# Patient Record
Sex: Female | Born: 1975 | Race: White | Hispanic: No | Marital: Married | Smoking: Never smoker
Health system: Southern US, Community
[De-identification: ages and names within clinical notes are randomized; demographics above are authoritative.]

## PROBLEM LIST (undated history)

## (undated) DIAGNOSIS — T8859XA Other complications of anesthesia, initial encounter: Secondary | ICD-10-CM

## (undated) DIAGNOSIS — G43909 Migraine, unspecified, not intractable, without status migrainosus: Secondary | ICD-10-CM

## (undated) DIAGNOSIS — Z8489 Family history of other specified conditions: Secondary | ICD-10-CM

## (undated) DIAGNOSIS — Z973 Presence of spectacles and contact lenses: Secondary | ICD-10-CM

---

## 2002-07-21 HISTORY — PX: CHOLECYSTECTOMY: SHX55

## 2003-06-22 ENCOUNTER — Inpatient Hospital Stay (HOSPITAL_COMMUNITY): Admission: RE | Admit: 2003-06-22 | Discharge: 2003-06-22 | Payer: Self-pay | Admitting: Internal Medicine

## 2003-06-23 HISTORY — PX: ERCP W/ SPHICTEROTOMY: SHX1523

## 2011-07-22 DIAGNOSIS — Z87442 Personal history of urinary calculi: Secondary | ICD-10-CM

## 2011-07-22 HISTORY — DX: Personal history of urinary calculi: Z87.442

## 2017-04-07 DIAGNOSIS — Z01419 Encounter for gynecological examination (general) (routine) without abnormal findings: Secondary | ICD-10-CM | POA: Diagnosis not present

## 2017-04-15 DIAGNOSIS — Z30430 Encounter for insertion of intrauterine contraceptive device: Secondary | ICD-10-CM | POA: Diagnosis not present

## 2017-04-29 DIAGNOSIS — Z30014 Encounter for initial prescription of intrauterine contraceptive device: Secondary | ICD-10-CM | POA: Diagnosis not present

## 2017-04-29 DIAGNOSIS — Z30433 Encounter for removal and reinsertion of intrauterine contraceptive device: Secondary | ICD-10-CM | POA: Diagnosis not present

## 2018-07-21 DIAGNOSIS — U071 COVID-19: Secondary | ICD-10-CM

## 2018-07-21 HISTORY — DX: COVID-19: U07.1

## 2018-12-06 DIAGNOSIS — G43009 Migraine without aura, not intractable, without status migrainosus: Secondary | ICD-10-CM | POA: Diagnosis not present

## 2018-12-06 DIAGNOSIS — Z6833 Body mass index (BMI) 33.0-33.9, adult: Secondary | ICD-10-CM | POA: Diagnosis not present

## 2019-01-17 DIAGNOSIS — Z6830 Body mass index (BMI) 30.0-30.9, adult: Secondary | ICD-10-CM | POA: Diagnosis not present

## 2019-01-17 DIAGNOSIS — R4582 Worries: Secondary | ICD-10-CM | POA: Diagnosis not present

## 2019-01-17 DIAGNOSIS — G43009 Migraine without aura, not intractable, without status migrainosus: Secondary | ICD-10-CM | POA: Diagnosis not present

## 2019-05-03 DIAGNOSIS — Z6830 Body mass index (BMI) 30.0-30.9, adult: Secondary | ICD-10-CM | POA: Diagnosis not present

## 2019-05-03 DIAGNOSIS — G43009 Migraine without aura, not intractable, without status migrainosus: Secondary | ICD-10-CM | POA: Diagnosis not present

## 2021-05-07 ENCOUNTER — Other Ambulatory Visit (HOSPITAL_COMMUNITY): Payer: Self-pay | Admitting: Family Medicine

## 2021-05-07 ENCOUNTER — Other Ambulatory Visit: Payer: Self-pay | Admitting: Family Medicine

## 2021-05-07 DIAGNOSIS — R319 Hematuria, unspecified: Secondary | ICD-10-CM

## 2021-05-07 DIAGNOSIS — N23 Unspecified renal colic: Secondary | ICD-10-CM

## 2021-05-10 ENCOUNTER — Other Ambulatory Visit (HOSPITAL_COMMUNITY): Payer: Self-pay | Admitting: Family Medicine

## 2021-05-10 DIAGNOSIS — Z1231 Encounter for screening mammogram for malignant neoplasm of breast: Secondary | ICD-10-CM

## 2021-05-22 ENCOUNTER — Ambulatory Visit (HOSPITAL_COMMUNITY): Payer: Self-pay

## 2021-05-24 ENCOUNTER — Ambulatory Visit (HOSPITAL_COMMUNITY)
Admission: RE | Admit: 2021-05-24 | Discharge: 2021-05-24 | Disposition: A | Discharge status: Home / Self Care | Payer: 59 | Source: Ambulatory Visit | Attending: Family Medicine | Admitting: Family Medicine

## 2021-05-24 ENCOUNTER — Other Ambulatory Visit: Payer: Self-pay

## 2021-05-24 DIAGNOSIS — N23 Unspecified renal colic: Secondary | ICD-10-CM | POA: Diagnosis present

## 2021-05-24 DIAGNOSIS — R319 Hematuria, unspecified: Secondary | ICD-10-CM | POA: Diagnosis present

## 2021-05-29 ENCOUNTER — Other Ambulatory Visit: Payer: Self-pay

## 2021-05-29 ENCOUNTER — Ambulatory Visit (HOSPITAL_COMMUNITY)
Admission: RE | Admit: 2021-05-29 | Discharge: 2021-05-29 | Disposition: A | Discharge status: Home / Self Care | Payer: 59 | Source: Ambulatory Visit | Attending: Family Medicine | Admitting: Family Medicine

## 2021-05-29 DIAGNOSIS — Z1231 Encounter for screening mammogram for malignant neoplasm of breast: Secondary | ICD-10-CM | POA: Diagnosis present

## 2021-06-04 ENCOUNTER — Other Ambulatory Visit: Payer: Self-pay | Admitting: Urology

## 2021-06-21 ENCOUNTER — Other Ambulatory Visit: Payer: Self-pay

## 2021-06-21 ENCOUNTER — Encounter (HOSPITAL_BASED_OUTPATIENT_CLINIC_OR_DEPARTMENT_OTHER): Payer: Self-pay | Admitting: Urology

## 2021-06-21 NOTE — Progress Notes (Signed)
Spoke w/ via phone for pre-op interview---patient Lab needs dos----urine POCT               Lab results------none COVID test -----patient states asymptomatic no test needed Arrive at -------0900 on 06/26/2021 NPO after MN NO Solid Food.  Clear liquids from MN until---0800 Med rec completed Medications to take morning of surgery -----Topamax  Diabetic medication -----n/a Patient instructed no nail polish to be worn day of surgery Patient instructed to bring photo id and insurance card day of surgery Patient aware to have Driver (ride ) / caregiver    for 24 hours after surgery  Patient Special Instructions -----none Pre-Op special Istructions -----none Patient verbalized understanding of instructions that were given at this phone interview. Patient denies shortness of breath, chest pain, fever, cough at this phone interview.   Reviewed case with Dr. Okey Dupre, anesthesiologist. Patient cannot receive succinylcholine or any drug its class due to not waking up and remaining on the vent for up to 13 hours after surgery. Per Dr. Okey Dupre, [facility] to do patient at [facility].

## 2021-06-26 ENCOUNTER — Ambulatory Visit (HOSPITAL_BASED_OUTPATIENT_CLINIC_OR_DEPARTMENT_OTHER): Payer: 59 | Admitting: Anesthesiology

## 2021-06-26 ENCOUNTER — Encounter (HOSPITAL_BASED_OUTPATIENT_CLINIC_OR_DEPARTMENT_OTHER)
Admission: RE | Disposition: A | Discharge status: Home / Self Care | Payer: Self-pay | Source: Home / Self Care | Attending: Urology

## 2021-06-26 ENCOUNTER — Ambulatory Visit (HOSPITAL_BASED_OUTPATIENT_CLINIC_OR_DEPARTMENT_OTHER)
Admission: RE | Admit: 2021-06-26 | Discharge: 2021-06-26 | Disposition: A | Discharge status: Home / Self Care | Payer: 59 | Attending: Urology | Admitting: Urology

## 2021-06-26 ENCOUNTER — Encounter (HOSPITAL_BASED_OUTPATIENT_CLINIC_OR_DEPARTMENT_OTHER): Payer: Self-pay | Admitting: Urology

## 2021-06-26 DIAGNOSIS — N132 Hydronephrosis with renal and ureteral calculous obstruction: Secondary | ICD-10-CM | POA: Diagnosis present

## 2021-06-26 DIAGNOSIS — R519 Headache, unspecified: Secondary | ICD-10-CM | POA: Diagnosis not present

## 2021-06-26 HISTORY — PX: CYSTOSCOPY/URETEROSCOPY/HOLMIUM LASER/STENT PLACEMENT: SHX6546

## 2021-06-26 HISTORY — DX: Presence of spectacles and contact lenses: Z97.3

## 2021-06-26 HISTORY — DX: Migraine, unspecified, not intractable, without status migrainosus: G43.909

## 2021-06-26 HISTORY — DX: Other complications of anesthesia, initial encounter: T88.59XA

## 2021-06-26 HISTORY — DX: Family history of other specified conditions: Z84.89

## 2021-06-26 LAB — POCT PREGNANCY, URINE: Preg Test, Ur: NEGATIVE

## 2021-06-26 SURGERY — CYSTOSCOPY/URETEROSCOPY/HOLMIUM LASER/STENT PLACEMENT
Anesthesia: General | Laterality: Left

## 2021-06-26 MED ORDER — ALBUMIN HUMAN 5 % IV SOLN
INTRAVENOUS | Status: AC
  Filled 2021-06-26: qty 250

## 2021-06-26 MED ORDER — ONDANSETRON HCL 4 MG/2ML IJ SOLN
INTRAMUSCULAR | Status: DC | PRN
  Administered 2021-06-26: 4 mg via INTRAVENOUS

## 2021-06-26 MED ORDER — ACETAMINOPHEN 500 MG PO TABS
1000.0000 mg | ORAL_TABLET | Freq: Once | ORAL | Status: AC
  Administered 2021-06-26: 1000 mg via ORAL

## 2021-06-26 MED ORDER — LIDOCAINE 2% (20 MG/ML) 5 ML SYRINGE
INTRAMUSCULAR | Status: AC
  Filled 2021-06-26: qty 5

## 2021-06-26 MED ORDER — PROPOFOL 500 MG/50ML IV EMUL
INTRAVENOUS | Status: DC | PRN
  Administered 2021-06-26: 125 ug/kg/min via INTRAVENOUS

## 2021-06-26 MED ORDER — IOHEXOL 300 MG/ML  SOLN
INTRAMUSCULAR | Status: DC | PRN
  Administered 2021-06-26: 10 mL

## 2021-06-26 MED ORDER — ONDANSETRON HCL 4 MG/2ML IJ SOLN
INTRAMUSCULAR | Status: AC
  Filled 2021-06-26: qty 2

## 2021-06-26 MED ORDER — LACTATED RINGERS IV SOLN: INTRAVENOUS | Status: DC

## 2021-06-26 MED ORDER — MIDAZOLAM HCL 5 MG/5ML IJ SOLN
INTRAMUSCULAR | Status: DC | PRN
  Administered 2021-06-26: 2 mg via INTRAVENOUS

## 2021-06-26 MED ORDER — HYDROCODONE-ACETAMINOPHEN 5-325 MG PO TABS: 1.0000 | ORAL_TABLET | ORAL | 0 refills | Status: AC | PRN

## 2021-06-26 MED ORDER — DEXAMETHASONE SODIUM PHOSPHATE 10 MG/ML IJ SOLN
INTRAMUSCULAR | Status: AC
  Filled 2021-06-26: qty 1

## 2021-06-26 MED ORDER — SODIUM CHLORIDE 0.9 % IR SOLN
Status: DC | PRN
  Administered 2021-06-26: 3000 mL

## 2021-06-26 MED ORDER — PROPOFOL 10 MG/ML IV BOLUS
INTRAVENOUS | Status: DC | PRN
  Administered 2021-06-26: 150 mg via INTRAVENOUS

## 2021-06-26 MED ORDER — DEXMEDETOMIDINE (PRECEDEX) IN NS 20 MCG/5ML (4 MCG/ML) IV SYRINGE
PREFILLED_SYRINGE | INTRAVENOUS | Status: AC
  Filled 2021-06-26: qty 5

## 2021-06-26 MED ORDER — ALBUMIN HUMAN 5 % IV SOLN
12.5000 g | Freq: Once | INTRAVENOUS | Status: AC
  Administered 2021-06-26: 12.5 g via INTRAVENOUS

## 2021-06-26 MED ORDER — FENTANYL CITRATE (PF) 100 MCG/2ML IJ SOLN: 25.0000 ug | INTRAMUSCULAR | Status: DC | PRN

## 2021-06-26 MED ORDER — LIDOCAINE 2% (20 MG/ML) 5 ML SYRINGE
INTRAMUSCULAR | Status: DC | PRN
  Administered 2021-06-26: 60 mg via INTRAVENOUS

## 2021-06-26 MED ORDER — CEFAZOLIN SODIUM-DEXTROSE 2-4 GM/100ML-% IV SOLN
2.0000 g | INTRAVENOUS | Status: AC
  Administered 2021-06-26: 2 g via INTRAVENOUS

## 2021-06-26 MED ORDER — MIDAZOLAM HCL 2 MG/2ML IJ SOLN
INTRAMUSCULAR | Status: AC
  Filled 2021-06-26: qty 2

## 2021-06-26 MED ORDER — FENTANYL CITRATE (PF) 100 MCG/2ML IJ SOLN
INTRAMUSCULAR | Status: DC | PRN
  Administered 2021-06-26: 100 ug via INTRAVENOUS

## 2021-06-26 MED ORDER — ACETAMINOPHEN 500 MG PO TABS
ORAL_TABLET | ORAL | Status: AC
  Filled 2021-06-26: qty 2

## 2021-06-26 MED ORDER — CEFAZOLIN SODIUM-DEXTROSE 2-4 GM/100ML-% IV SOLN
INTRAVENOUS | Status: AC
  Filled 2021-06-26: qty 100

## 2021-06-26 MED ORDER — DEXAMETHASONE SODIUM PHOSPHATE 4 MG/ML IJ SOLN
INTRAMUSCULAR | Status: DC | PRN
  Administered 2021-06-26: 10 mg via INTRAVENOUS

## 2021-06-26 MED ORDER — FENTANYL CITRATE (PF) 250 MCG/5ML IJ SOLN
INTRAMUSCULAR | Status: AC
  Filled 2021-06-26: qty 5

## 2021-06-26 MED ORDER — DEXMEDETOMIDINE (PRECEDEX) IN NS 20 MCG/5ML (4 MCG/ML) IV SYRINGE
PREFILLED_SYRINGE | INTRAVENOUS | Status: DC | PRN
  Administered 2021-06-26: 8 ug via INTRAVENOUS

## 2021-06-26 SURGICAL SUPPLY — 22 items
BAG DRAIN URO-CYSTO SKYTR STRL (DRAIN) ×2 IMPLANT
BAG DRN UROCATH (DRAIN) ×1
BASKET ZERO TIP NITINOL 2.4FR (BASKET) ×2 IMPLANT
BSKT STON RTRVL ZERO TP 2.4FR (BASKET) ×1
CATH INTERMIT  6FR 70CM (CATHETERS) IMPLANT
CLOTH BEACON ORANGE TIMEOUT ST (SAFETY) IMPLANT
COVER DOME SNAP 22 D (MISCELLANEOUS) ×2 IMPLANT
FIBER LASER FLEXIVA 365 (UROLOGICAL SUPPLIES) IMPLANT
GLOVE SURG ENC MOIS LTX SZ7.5 (GLOVE) ×4 IMPLANT
GOWN STRL REUS W/TWL XL LVL3 (GOWN DISPOSABLE) ×4 IMPLANT
GUIDEWIRE STR DUAL SENSOR (WIRE) ×4 IMPLANT
IV NS IRRIG 3000ML ARTHROMATIC (IV SOLUTION) ×2 IMPLANT
KIT TURNOVER CYSTO (KITS) ×2 IMPLANT
MANIFOLD NEPTUNE II (INSTRUMENTS) ×2 IMPLANT
NS IRRIG 500ML POUR BTL (IV SOLUTION) IMPLANT
PACK CYSTO (CUSTOM PROCEDURE TRAY) ×2 IMPLANT
SHEATH URETERAL 12FRX35CM (MISCELLANEOUS) ×2 IMPLANT
STENT URET 6FRX24 CONTOUR (STENTS) ×2 IMPLANT
TRACTIP FLEXIVA PULS ID 200XHI (Laser) ×1 IMPLANT
TRACTIP FLEXIVA PULSE ID 200 (Laser) ×2
TUBE CONNECTING 12X1/4 (SUCTIONS) ×2 IMPLANT
TUBING UROLOGY SET (TUBING) IMPLANT

## 2021-06-26 NOTE — Transfer of Care (Signed)
Immediate Anesthesia Transfer of Care Note  Patient: Kari Kim  Procedure(s) Performed: CYSTOSCOPY/URETEROSCOPY/HOLMIUM LASER/STENT PLACEMENT (Left)  Patient Location: PACU  Anesthesia Type:General  Level of Consciousness: awake  Airway & Oxygen Therapy: Patient Spontanous Breathing and Patient connected to nasal cannula oxygen  Post-op Assessment: Report given to RN and Post -op Vital signs reviewed and stable  Post vital signs: Reviewed and stable  Last Vitals:  Vitals Value Taken Time  BP 111/64 06/26/21 1149  Temp    Pulse 96 06/26/21 1153  Resp 13 06/26/21 1153  SpO2 100 % 06/26/21 1153  Vitals shown include unvalidated device data.  Last Pain:  Vitals:   06/26/21 0909  TempSrc: Oral  PainSc: 0-No pain      Patients Stated Pain Goal: 7 (06/26/21 0909)  Complications: No notable events documented.

## 2021-06-26 NOTE — Anesthesia Procedure Notes (Signed)
Procedure Name: LMA Insertion Date/Time: 06/26/2021 11:08 AM Performed by: Caren Macadam, CRNA Pre-anesthesia Checklist: Patient identified, Emergency Drugs available, Suction available and Patient being monitored Patient Re-evaluated:Patient Re-evaluated prior to induction Oxygen Delivery Method: Circle system utilized Preoxygenation: Pre-oxygenation with 100% oxygen Induction Type: IV induction Ventilation: Mask ventilation without difficulty LMA: LMA inserted LMA Size: 4.0 Number of attempts: 1 Placement Confirmation: positive ETCO2 and breath sounds checked- equal and bilateral Tube secured with: Tape Dental Injury: Teeth and Oropharynx as per pre-operative assessment

## 2021-06-26 NOTE — Op Note (Signed)
Operative Note  Preoperative diagnosis:  1.  Left ureteral calculus  Postoperative diagnosis: 1.  Left ureteral calculus  Procedure(s): 1.  Cystoscopy with left retrograde pyelogram, left ureteroscopy with laser lithotripsy, ureteral stent placement  Surgeon: Modena Slater, MD  Assistants: None  Anesthesia: General  Complications: None immediate  EBL: Minimal  Specimens: 1.  Renal calculus  Drains/Catheters: 1.  6 x 24 double-J ureteral stent  Intraoperative findings: 1.  Normal urethra and bladder 2.  Proximally 5 mm proximal ureteral calculus there was retropulsed into the kidney and then basket extracted.  All stone fragments were removed.  Retrograde pyelogram revealed no evidence of filling defect after removal of the stone.  Indication: Kari Kim with a left ureteral calculus presents for the previously mentioned operation.  Description of procedure:  The patient was identified and consent was obtained.  The patient was taken to the operating room and placed in the supine position.  The patient was placed under general anesthesia.  Perioperative antibiotics were administered.  The patient was placed in dorsal lithotomy.  Patient was prepped and draped in a standard sterile fashion and a timeout was performed.  A 21 French rigid cystoscope was advanced into the urethra and into the bladder.  Complete cystoscopy was performed with no abnormal findings.  The left ureter was cannulated with a sensor wire which was advanced up to the kidney under fluoroscopic guidance.  Semirigid ureteroscopy was performed alongside the wire up into the proximal ureter.  A stone was encountered.  It was fragmented to smaller fragments and then retropulsed into the kidney.  A second wire was advanced through the scope and into the kidney and the scope was withdrawn.  A 12 x 14 ureteral access sheath was advanced over the wire under continuous fluoroscopic guidance up to the ureteropelvic  junction.  Inner sheath and wire were withdrawn.  Digital ureteroscopy was performed.  The stone was now small enough to basket extract and this was collected for specimen.  There was one other tiny fragment that was extracted.  There were no other clinically significant stone fragments remaining.  I shot a retrograde pyelogram through the scope with findings noted above.  I withdrew the scope along with the access sheath visualizing the entire ureter upon removal.  There was some ureteral edema at the level of stone impaction but no obvious ureteral injury.  I backloaded the wire onto rigid cystoscope and advanced that into the bladder followed by routine placement of a 6 x 24 double-J ureteral stent.  Fluoroscopy confirmed proximal placement and direct visualization confirmed a good coil within the bladder.  I drained the bladder and withdrew the scope.  Patient tolerated the procedure well was stable postoperative.  Plan: Follow-up in 1 week for stent removal

## 2021-06-26 NOTE — Discharge Instructions (Addendum)
[facility] [phone] Post Ureteroscopy With or Without Stent Instructions  Definitions:  Ureter: The duct that transports urine from the kidney to the bladder. Stent:   A plastic hollow tube that is placed into the ureter, from the kidney to the                 bladder to prevent the ureter from swelling shut.  GENERAL INSTRUCTIONS:  Despite the fact that no skin incisions were used, the area around the ureter and bladder is raw and irritated. The stent is a foreign body which will further irritate the bladder wall. This irritation is manifested by increased frequency of urination, both day and night, and by an increase in the urge to urinate. In some, the urge to urinate is present almost always. Sometimes the urge is strong enough that you may not be able to stop yourself from urinating. The only real cure is to remove the stent and then give time for the bladder wall to heal which can't be done until the danger of the ureter swelling shut has passed, which varies.  You may see some blood in your urine while the stent is in place and a few days afterwards. Do not be alarmed, even if the urine was clear for a while. Get off your feet and drink lots of fluids until clearing occurs. If you start to pass clots or don't improve, call us.  DIET: You may return to your normal diet immediately. Because of the raw surface of your bladder, alcohol, spicy foods, acid type foods and drinks with caffeine may cause irritation or frequency and should be used in moderation. To keep your urine flowing freely and to avoid constipation, drink plenty of fluids during the day ( 8-10 glasses ). Tip: Avoid cranberry juice because it is very acidic.  ACTIVITY: Your physical activity doesn't need to be restricted. However, if you are very active, you may see some blood in your urine. We suggest that you reduce your activity under these circumstances until the bleeding has stopped.  BOWELS: It is  important to keep your bowels regular during the postoperative period. Straining with bowel movements can cause bleeding. A bowel movement every other day is reasonable. Use a mild laxative if needed, such as Milk of Magnesia 2-3 tablespoons, or 2 Dulcolax tablets. Call if you continue to have problems. If you have been taking narcotics for pain, before, during or after your surgery, you may be constipated. Take a laxative if necessary.   MEDICATION: You should resume your pre-surgery medications unless told not to. You may take oxybutynin or flomax if prescribed for bladder spasms or discomfort from the stent Take pain medication as directed for pain refractory to conservative management  PROBLEMS YOU SHOULD REPORT TO [place]: Fevers over 100.5 Fahrenheit. Heavy bleeding, or clots ( See above notes about blood in urine ). Inability to urinate. Drug reactions ( hives, rash, nausea, vomiting, diarrhea ). Severe burning or pain with urination that is not improving.    No acetaminophen/Tylenol until after 3:15pm today if needed for pain.     Post Anesthesia Home Care Instructions  Activity: Get plenty of rest for the remainder of the day. A responsible individual must stay with you for 24 hours following the procedure.  For the next 24 hours, DO NOT: -Drive a car -Advertising copywriter -Drink alcoholic beverages -Take any medication unless instructed by your physician -Make any legal decisions or sign important papers.  Meals: Start with liquid  foods such as gelatin or soup. Progress to regular foods as tolerated. Avoid greasy, spicy, heavy foods. If nausea and/or vomiting occur, drink only clear liquids until the nausea and/or vomiting subsides. Call your physician if vomiting continues.  Special Instructions/Symptoms: Your throat may feel dry or sore from the anesthesia or the breathing tube placed in your throat during surgery. If this causes discomfort, gargle with warm salt water. The  discomfort should disappear within 24 hours.  If you had a scopolamine patch placed behind your ear for the management of post- operative nausea and/or vomiting:  1. The medication in the patch is effective for 72 hours, after which it should be removed.  Wrap patch in a tissue and discard in the trash. Wash hands thoroughly with soap and water. 2. You may remove the patch earlier than 72 hours if you experience unpleasant side effects which may include dry mouth, dizziness or visual disturbances. 3. Avoid touching the patch. Wash your hands with soap and water after contact with the patch.

## 2021-06-26 NOTE — Anesthesia Preprocedure Evaluation (Addendum)
Anesthesia Evaluation  Patient identified by MRN, date of birth, ID band Patient awake    Reviewed: Allergy & Precautions, H&P , NPO status , Patient's Chart, lab work & pertinent test results  History of Anesthesia Complications (+) PSEUDOCHOLINESTERASE DEFICIENCY and Family history of anesthesia reaction  Airway Mallampati: II  TM Distance: >3 FB Neck ROM: Full    Dental no notable dental hx. (+) Teeth Intact, Dental Advisory Given   Pulmonary neg pulmonary ROS,    Pulmonary exam normal breath sounds clear to auscultation       Cardiovascular negative cardio ROS   Rhythm:Regular Rate:Normal     Neuro/Psych  Headaches, negative psych ROS   GI/Hepatic negative GI ROS, Neg liver ROS,   Endo/Other  negative endocrine ROS  Renal/GU negative Renal ROS  negative genitourinary   Musculoskeletal   Abdominal   Peds  Hematology negative hematology ROS (+)   Anesthesia Other Findings   Reproductive/Obstetrics negative OB ROS                            Anesthesia Physical Anesthesia Plan  ASA: 2  Anesthesia Plan: General   Post-op Pain Management: Tylenol PO (pre-op)   Induction: Intravenous  PONV Risk Score and Plan: 4 or greater and Ondansetron, Dexamethasone, Midazolam, Propofol infusion and TIVA  Airway Management Planned: LMA  Additional Equipment:   Intra-op Plan:   Post-operative Plan: Extubation in OR  Informed Consent: I have reviewed the patients History and Physical, chart, labs and discussed the procedure including the risks, benefits and alternatives for the proposed anesthesia with the patient or authorized representative who has indicated his/her understanding and acceptance.     Dental advisory given  Plan Discussed with: CRNA  Anesthesia Plan Comments:        Anesthesia Quick Evaluation

## 2021-06-26 NOTE — Anesthesia Postprocedure Evaluation (Signed)
Anesthesia Post Note  Patient: Kari Kim  Procedure(s) Performed: CYSTOSCOPY/URETEROSCOPY/HOLMIUM LASER/STENT PLACEMENT (Left)     Patient location during evaluation: PACU Anesthesia Type: General Level of consciousness: awake and alert Pain management: pain level controlled Vital Signs Assessment: post-procedure vital signs reviewed and stable Respiratory status: spontaneous breathing, nonlabored ventilation and respiratory function stable Cardiovascular status: blood pressure returned to baseline and stable Postop Assessment: no apparent nausea or vomiting Anesthetic complications: no   No notable events documented.  Last Vitals:  Vitals:   06/26/21 1200 06/26/21 1216  BP: 109/76 100/63  Pulse: 81 71  Resp: 16 15  Temp:  36.6 C  SpO2: 98% 97%    Last Pain:  Vitals:   06/26/21 1216  TempSrc:   PainSc: 3                  Januel Doolan,W. EDMOND

## 2021-06-26 NOTE — H&P (Signed)
CC/HPI: CC: Left ureteral calculus  HPI:  01/31/2021  45-year-old female underwent CT scan of the abdomen and pelvis without contrast that revealed a 5 mm proximal left ureteral calculus with moderate left hydroureteronephrosis. She had punctate bilateral nonobstructing renal calculi as well. She has not had any pain. Urinalysis is negative.     ALLERGIES: succinylcholine    MEDICATIONS: Hair, Skin And Nails  Topamax     GU PSH: None   NON-GU PSH: Cesarean Delivery Cholecystectomy (laparoscopic)     GU PMH: None   NON-GU PMH: None   FAMILY HISTORY: 1 Daughter - Other 3 Son's - Other nephrolithiasis - Father Prostate Cancer - Father   SOCIAL HISTORY: Marital Status: Unknown Preferred Language: English; Race: White Current Smoking Status: Patient has never smoked.   Tobacco Use Assessment Completed: Used Tobacco in last 30 days? Drinks 4+ caffeinated drinks per day.    REVIEW OF SYSTEMS:    GU Review Female:   Patient denies frequent urination, hard to postpone urination, burning /pain with urination, get up at night to urinate, leakage of urine, stream starts and stops, trouble starting your stream, have to strain to urinate, and being pregnant.  Gastrointestinal (Upper):   Patient denies nausea, vomiting, and indigestion/ heartburn.  Gastrointestinal (Lower):   Patient denies diarrhea and constipation.  Constitutional:   Patient denies fever, night sweats, weight loss, and fatigue.  Skin:   Patient denies skin rash/ lesion and itching.  Eyes:   Patient denies blurred vision and double vision.  Ears/ Nose/ Throat:   Patient denies sore throat and sinus problems.  Hematologic/Lymphatic:   Patient denies swollen glands and easy bruising.  Cardiovascular:   Patient denies leg swelling and chest pains.  Respiratory:   Patient denies cough and shortness of breath.  Endocrine:   Patient denies excessive thirst.  Musculoskeletal:   Patient denies back pain and joint pain.   Neurological:   Patient reports headaches. Patient denies dizziness.  Psychologic:   Patient denies depression and anxiety.   VITAL SIGNS:      06/03/2021 03:53 PM  Weight 175 lb / 79.38 kg  Height 64 in / 162.56 cm  BP 116/76 mmHg  Heart Rate 103 /min  Temperature 97.0 F / 36.1 C  BMI 30.0 kg/m   MULTI-SYSTEM PHYSICAL EXAMINATION:    Constitutional: Well-nourished. No physical deformities. Normally developed. Good grooming.  Respiratory: No labored breathing, no use of accessory muscles.   Cardiovascular: Normal temperature, normal extremity pulses, no swelling, no varicosities.  Skin: No paleness, no jaundice, no cyanosis. No lesion, no ulcer, no rash.  Neurologic / Psychiatric: Oriented to time, oriented to place, oriented to person. No depression, no anxiety, no agitation.  Gastrointestinal: No mass, no tenderness, no rigidity, non obese abdomen.  Eyes: Normal conjunctivae. Normal eyelids.  Musculoskeletal: Normal gait and station of head and neck.     Complexity of Data:  Source Of History:  Patient  Records Review:   Previous Doctor Records, Previous Patient Records  Urine Test Review:   Urinalysis  X-Ray Review: C.T. Abdomen/Pelvis: Reviewed Films. Reviewed Report. Discussed With Patient.     PROCEDURES:         Renal Ultrasound (Limited) - 76775  LT Kidney: Length:10.6 cm Depth: 5.0 cm Cortical Width: 1.5 cm Width:5.2 cm    Left Kidney/Ureter:  Multiple echogenic foci, largest= 0.28cm UP, Hydro noted, ? AML LP= 0.76cm  Bladder:  PVR= 36.18ML      Patient confirmed No Neulasta OnPro Device.              KUB - [id number]  A single view of the abdomen is obtained.  Radiopacity in the left abdomen stone versus corner of transverse process      Patient confirmed No Neulasta OnPro Device.           Urinalysis Dipstick Dipstick Cont'd  Color: Yellow Bilirubin: Neg mg/dL  Appearance: Clear Ketones: Neg mg/dL  Specific Gravity: 1.020 Blood: Neg ery/uL  pH:  5.5 Protein: Trace mg/dL  Glucose: Neg mg/dL Urobilinogen: 0.2 mg/dL    Nitrites: Neg    Leukocyte Esterase: Neg leu/uL    ASSESSMENT:      ICD-10 Details  1 GU:   Renal and ureteral calculus - N20.2 Undiagnosed New Problem  2   Ureteral obstruction secondary to calculous - N13.2 Undiagnosed New Problem   PLAN:           Orders X-Rays: Renal Ultrasound (Limited) - Left kidney          Schedule         Document Letter(s):  Created for Patient: Clinical Summary         Notes:   We discussed the management of urinary stones. These options include observation, ureteroscopy, and shockwave lithotripsy. We discussed which options are relevant to these particular stones. We discussed the natural history of stones as well as the complications of untreated stones and the impact on quality of life without treatment as well as with each of the above listed treatments. We also discussed the efficacy of each treatment in its ability to clear the stone burden. With any of these management options I discussed the signs and symptoms of infection and the need for emergent treatment should these be experienced. For each option we discussed the ability of each procedure to clear the patient of their stone burden.   For observation I described the risks which include but are not limited to silent renal damage, life-threatening infection, need for emergent surgery, failure to pass stone, and pain.   For ureteroscopy I described the risks which include heart attack, stroke, pulmonary embolus, death, bleeding, infection, damage to contiguous structures, positioning injury, ureteral stricture, ureteral avulsion, ureteral injury, need for ureteral stent, inability to perform ureteroscopy, need for an interval procedure, inability to clear stone burden, stent discomfort and pain.   For shockwave lithotripsy I described the risks which include arrhythmia, kidney contusion, kidney hemorrhage, need for transfusion,  pain, inability to break up stone, inability to pass stone fragments, Steinstrasse, infection associated with obstructing stones, need for different surgical procedure, need for repeat shockwave lithotripsy.   She would like to proceed with ureteroscopy.   CC: Dr. Daniel    Signed by Christana Angelica, III, M.D. on 06/03/21 at 4:54 PM (EST 

## 2021-06-27 ENCOUNTER — Encounter (HOSPITAL_BASED_OUTPATIENT_CLINIC_OR_DEPARTMENT_OTHER): Payer: Self-pay | Admitting: Urology

## 2021-09-30 DIAGNOSIS — Z6831 Body mass index (BMI) 31.0-31.9, adult: Secondary | ICD-10-CM | POA: Diagnosis not present

## 2021-09-30 DIAGNOSIS — J020 Streptococcal pharyngitis: Secondary | ICD-10-CM | POA: Diagnosis not present

## 2022-08-19 DIAGNOSIS — R509 Fever, unspecified: Secondary | ICD-10-CM | POA: Diagnosis not present

## 2022-08-19 DIAGNOSIS — R030 Elevated blood-pressure reading, without diagnosis of hypertension: Secondary | ICD-10-CM | POA: Diagnosis not present

## 2022-08-19 DIAGNOSIS — G43009 Migraine without aura, not intractable, without status migrainosus: Secondary | ICD-10-CM | POA: Diagnosis not present

## 2022-08-19 DIAGNOSIS — Z20828 Contact with and (suspected) exposure to other viral communicable diseases: Secondary | ICD-10-CM | POA: Diagnosis not present

## 2022-08-19 DIAGNOSIS — Z6831 Body mass index (BMI) 31.0-31.9, adult: Secondary | ICD-10-CM | POA: Diagnosis not present

## 2022-12-31 DIAGNOSIS — Z13220 Encounter for screening for lipoid disorders: Secondary | ICD-10-CM | POA: Diagnosis not present

## 2022-12-31 DIAGNOSIS — Z0000 Encounter for general adult medical examination without abnormal findings: Secondary | ICD-10-CM | POA: Diagnosis not present

## 2022-12-31 DIAGNOSIS — Z1329 Encounter for screening for other suspected endocrine disorder: Secondary | ICD-10-CM | POA: Diagnosis not present

## 2023-01-05 IMAGING — MG MM DIGITAL SCREENING BILAT W/ TOMO AND CAD
6 of 12 series · 6 of 36 positions shown · non-contrast
Comparison: None.

CLINICAL DATA: Screening.

EXAM:
DIGITAL SCREENING BILATERAL MAMMOGRAM WITH TOMOSYNTHESIS AND CAD
TECHNIQUE: Bilateral screening digital craniocaudal and mediolateral oblique
mammograms were obtained. Bilateral screening digital breast
tomosynthesis was performed. The images were evaluated with
computer-aided detection.

[L MLO synth-2D (1 of 2)]
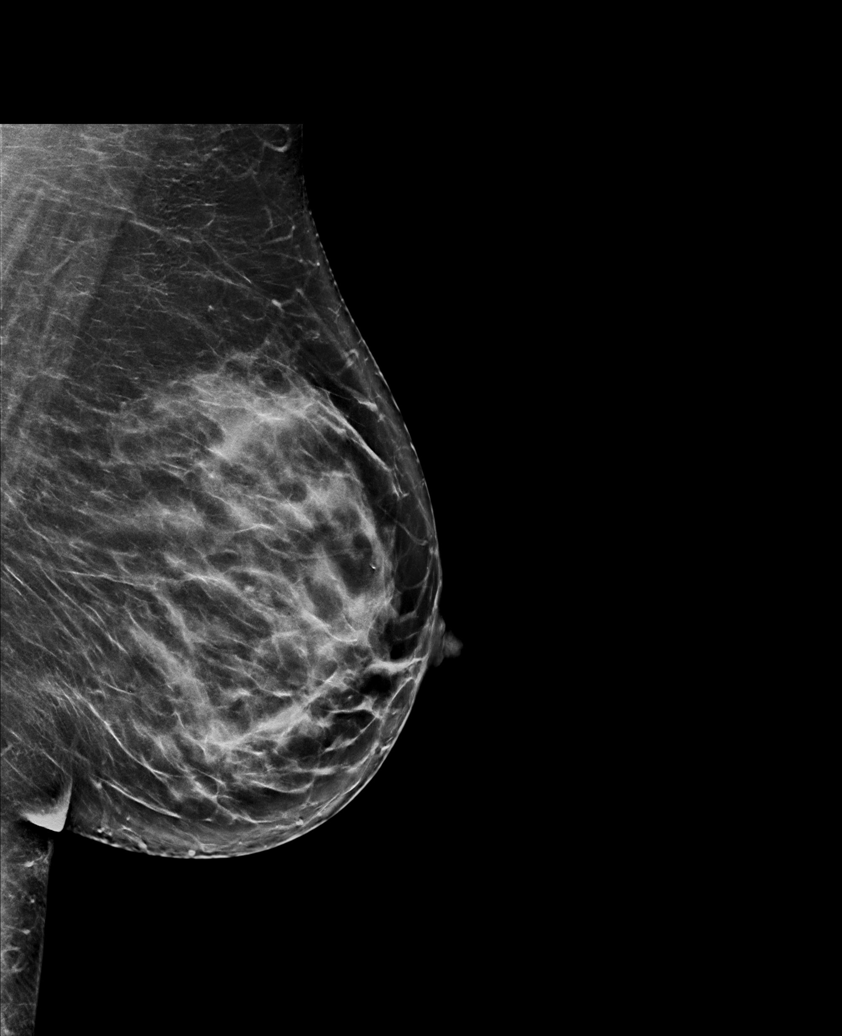

[L CC synth-2D]
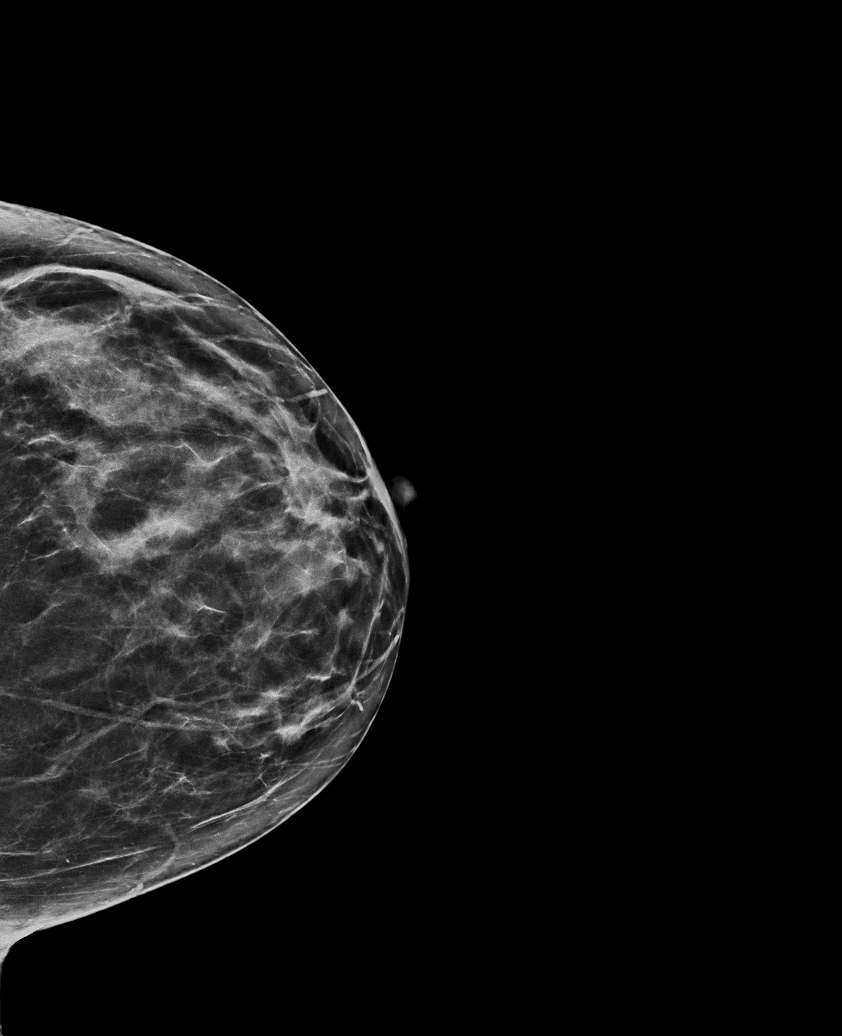

[R MLO synth-2D (1 of 2)]
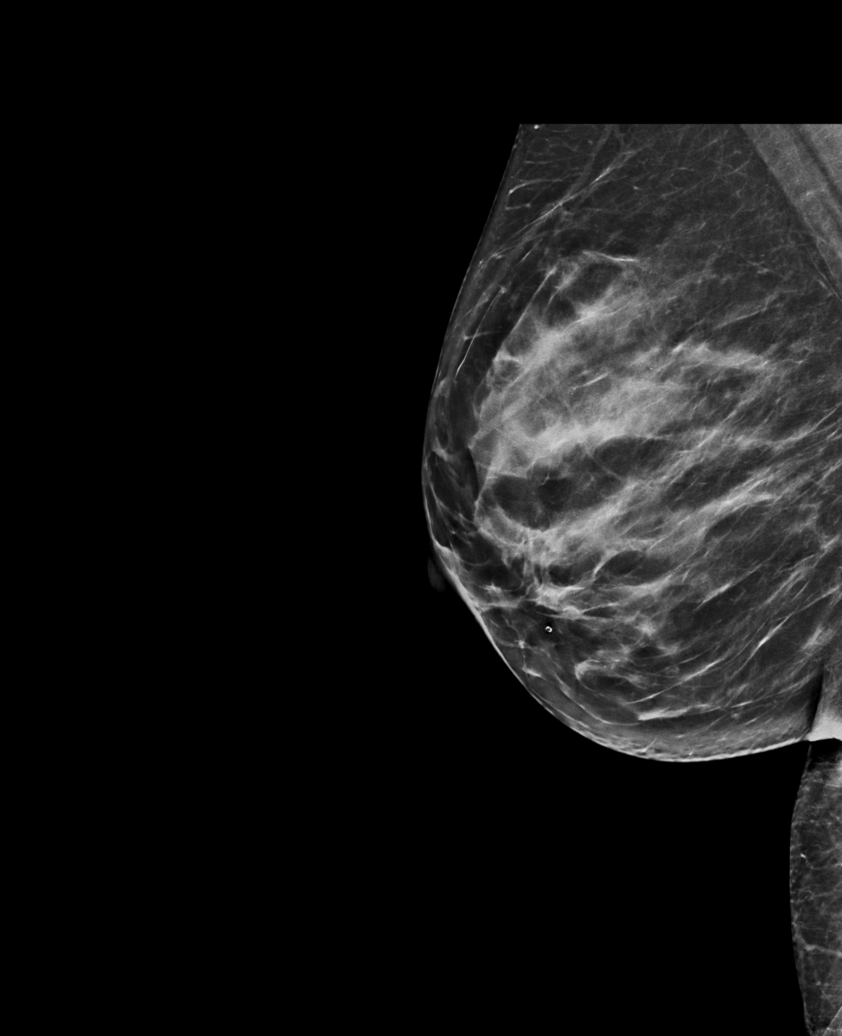

[L MLO synth-2D (2 of 2)]
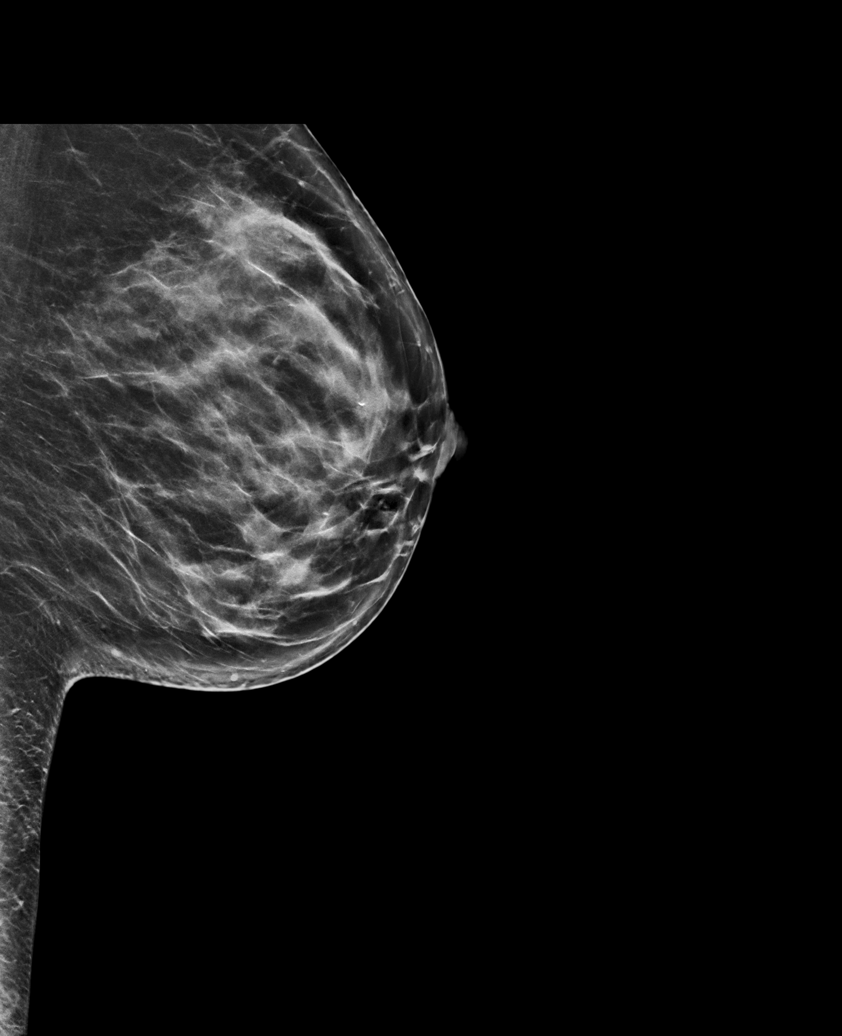

[R MLO synth-2D (2 of 2)]
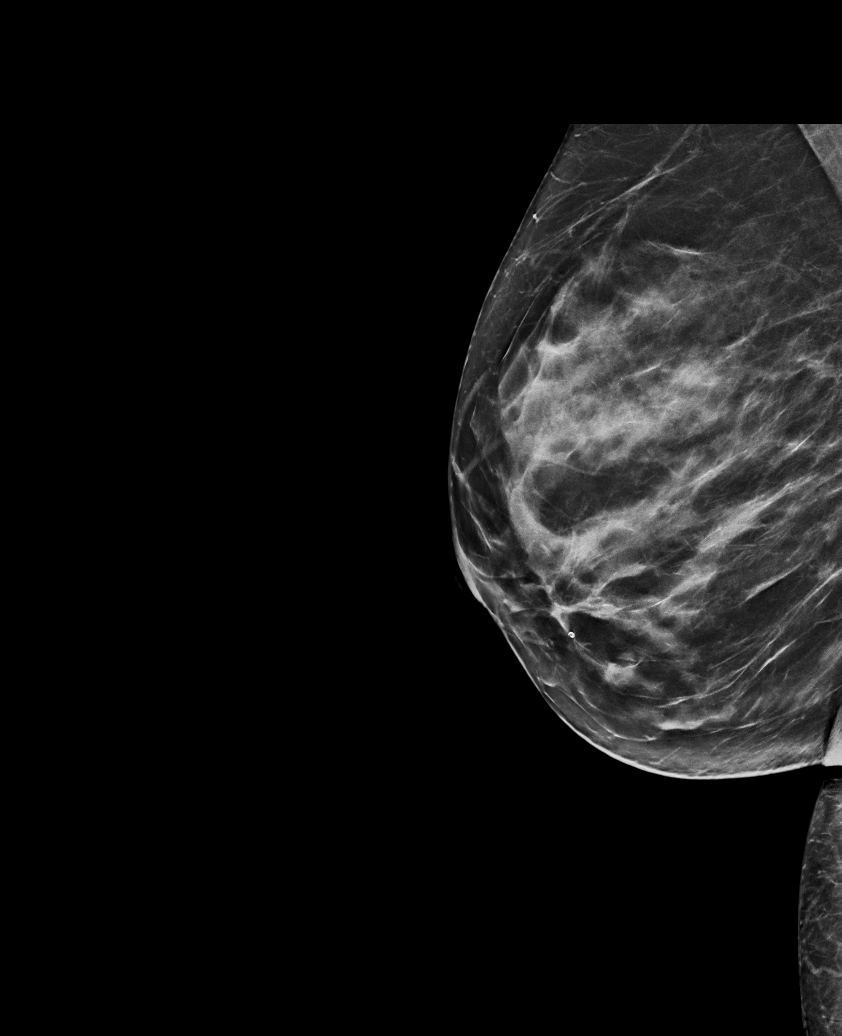

[R CC synth-2D]
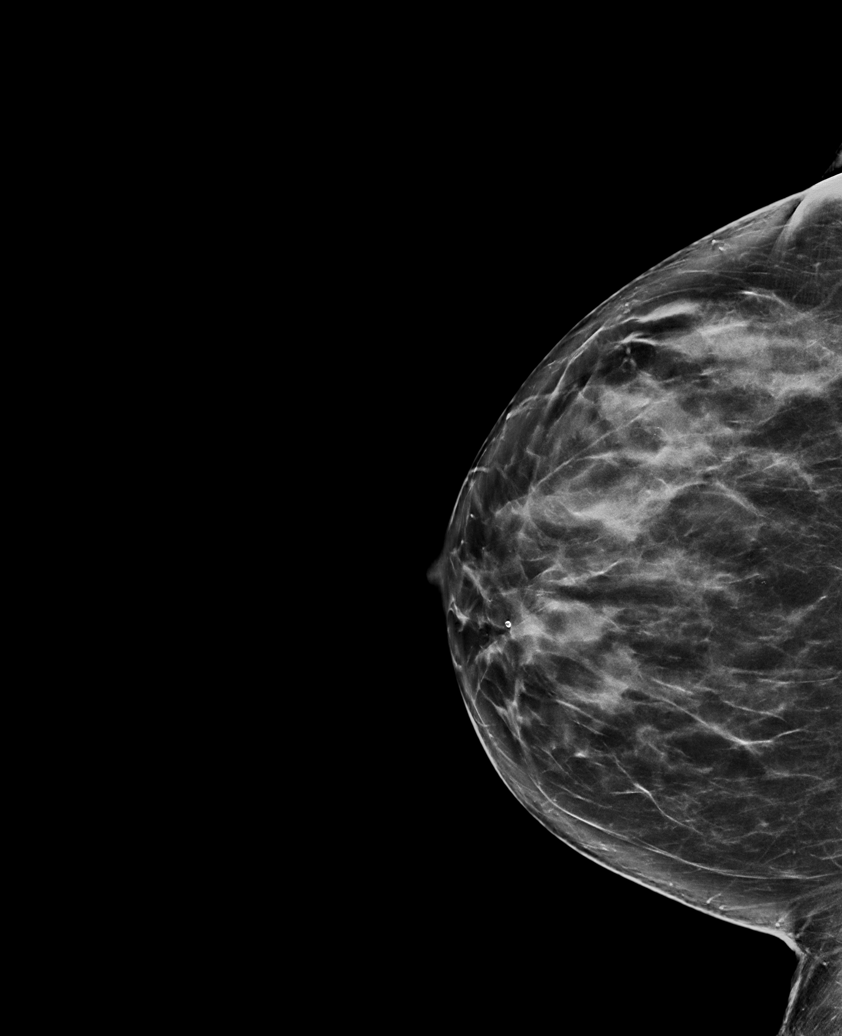

[6 of 36 positions shown; findings below may reference images not displayed]

ACR Breast Density Category c: The breast tissue is heterogeneously
dense, which may obscure small masses
FINDINGS: There are no findings suspicious for malignancy.
IMPRESSION: No mammographic evidence of malignancy. A result letter of this
screening mammogram will be mailed directly to the patient.

RECOMMENDATION:
Screening mammogram in one year. (Code:C8-T-HNK)

BI-RADS CATEGORY  1: Negative.

## 2023-01-12 ENCOUNTER — Other Ambulatory Visit (HOSPITAL_COMMUNITY): Payer: Self-pay | Admitting: Physician Assistant

## 2023-01-12 DIAGNOSIS — R030 Elevated blood-pressure reading, without diagnosis of hypertension: Secondary | ICD-10-CM | POA: Diagnosis not present

## 2023-01-12 DIAGNOSIS — Z6831 Body mass index (BMI) 31.0-31.9, adult: Secondary | ICD-10-CM | POA: Diagnosis not present

## 2023-01-12 DIAGNOSIS — R635 Abnormal weight gain: Secondary | ICD-10-CM | POA: Diagnosis not present

## 2023-01-12 DIAGNOSIS — G43009 Migraine without aura, not intractable, without status migrainosus: Secondary | ICD-10-CM | POA: Diagnosis not present

## 2023-01-12 DIAGNOSIS — Z1231 Encounter for screening mammogram for malignant neoplasm of breast: Secondary | ICD-10-CM

## 2023-01-15 ENCOUNTER — Telehealth: Payer: Self-pay

## 2023-01-15 NOTE — Telephone Encounter (Signed)
I called the patient in regards to a referral that we rescvd from [facility] to schedule pt appt for referral. No answer but I was able to leave a voice mail for the patient to call me back. ep

## 2023-01-29 ENCOUNTER — Ambulatory Visit (HOSPITAL_COMMUNITY)
Admission: RE | Admit: 2023-01-29 | Discharge: 2023-01-29 | Disposition: A | Discharge status: Home / Self Care | Payer: 59 | Source: Ambulatory Visit | Attending: Physician Assistant | Admitting: Physician Assistant

## 2023-01-29 ENCOUNTER — Encounter (HOSPITAL_COMMUNITY): Payer: Self-pay

## 2023-01-29 DIAGNOSIS — Z1231 Encounter for screening mammogram for malignant neoplasm of breast: Secondary | ICD-10-CM | POA: Insufficient documentation

## 2023-02-13 ENCOUNTER — Other Ambulatory Visit (HOSPITAL_COMMUNITY)
Admission: RE | Admit: 2023-02-13 | Discharge: 2023-02-13 | Disposition: A | Discharge status: Home / Self Care | Payer: 59 | Source: Ambulatory Visit | Attending: Adult Health | Admitting: Adult Health

## 2023-02-13 ENCOUNTER — Ambulatory Visit (INDEPENDENT_AMBULATORY_CARE_PROVIDER_SITE_OTHER): Payer: 59 | Admitting: Adult Health

## 2023-02-13 ENCOUNTER — Encounter: Payer: Self-pay | Admitting: Adult Health

## 2023-02-13 VITALS — BP 125/81 | HR 96 | Ht 64.0 in | Wt 175.5 lb

## 2023-02-13 DIAGNOSIS — T8332XA Displacement of intrauterine contraceptive device, initial encounter: Secondary | ICD-10-CM | POA: Insufficient documentation

## 2023-02-13 DIAGNOSIS — Z01419 Encounter for gynecological examination (general) (routine) without abnormal findings: Secondary | ICD-10-CM

## 2023-02-13 DIAGNOSIS — Z1211 Encounter for screening for malignant neoplasm of colon: Secondary | ICD-10-CM | POA: Diagnosis not present

## 2023-02-13 DIAGNOSIS — Z1330 Encounter for screening examination for mental health and behavioral disorders, unspecified: Secondary | ICD-10-CM | POA: Diagnosis not present

## 2023-02-13 LAB — HEMOCCULT GUIAC POC 1CARD (OFFICE): Fecal Occult Blood, POC: NEGATIVE

## 2023-02-13 NOTE — Progress Notes (Signed)
Patient ID: Kari Kim, female   DOB: October 11, 1975, 47 y.o.   MRN: [id number] History of Present Illness: Kari Kim is a 47 year old white female, married, [id number] in for a well woman gyn exam and pap. She has not  had a pap since 2016. She has a [place] IUD and had period this month, usually does not have, it was placed in 2018.   PCP is Dr Reuel Boom    Current Medications, Allergies, Past Medical History, Past Surgical History, Family History and Social History were reviewed in [employer] electronic medical record.     Review of Systems: Patient denies any headaches, hearing loss, fatigue, blurred vision, shortness of breath, chest pain, abdominal pain, problems with bowel movements, urination, or intercourse. No joint pain or mood swings. Has some itching at times. Had period with IUD this month   Physical Exam:BP 125/81 (BP Location: Left Arm, Patient Position: Sitting, Cuff Size: Normal)   Pulse 96   Ht 5\' 4"  (1.626 m)   Wt 175 lb 8 oz (79.6 kg)   LMP 02/07/2023   BMI 30.12 kg/m   General:  Well developed, well nourished, no acute distress Skin:  Warm and dry Neck:  Midline trachea, normal thyroid, good ROM, no lymphadenopathy Lungs; Clear to auscultation bilaterally Breast:  No dominant palpable mass, retraction, or nipple discharge Cardiovascular: Regular rate and rhythm Abdomen:  Soft, non tender, no hepatosplenomegaly Pelvic:  External genitalia is normal in appearance, no lesions. The vagina is normal in appearance. Urethra has no lesions or masses. The cervix is bulbous.No IUD strings seen, pap with HR HPV genotyping performed.  Uterus is felt to be normal size, shape, and contour.  No adnexal masses or tenderness noted.Bladder is non tender, no masses felt. Rectal: Good sphincter tone, no polyps, or hemorrhoids felt.  Hemoccult negative. Extremities/musculoskeletal:  No swelling or varicosities noted, no clubbing or cyanosis Psych:  No mood changes, alert and cooperative,seems  happy AA is 3 Fall risk is low    02/13/2023   11:39 AM  Depression screen PHQ 2/9  Decreased Interest 0  Down, Depressed, Hopeless 0  PHQ - 2 Score 0  Altered sleeping 0  Tired, decreased energy 0  Change in appetite 0  Feeling bad or failure about yourself  0  Trouble concentrating 0  Moving slowly or fidgety/restless 0  PHQ-9 Score 0       02/13/2023   11:39 AM  GAD 7 : Generalized Anxiety Score  Nervous, Anxious, on Edge 0  Control/stop worrying 0  Worry too much - different things 0  Trouble relaxing 0  Restless 0  Easily annoyed or irritable 0  Afraid - awful might happen 0  Total GAD 7 Score 0      Upstream - 02/13/23 1157       Pregnancy Intention Screening   Does the patient want to become pregnant in the next year? No    Does the patient's partner want to become pregnant in the next year? No    Would the patient like to discuss contraceptive options today? No      Contraception Wrap Up   Current Method IUD or IUS    End Method IUD or IUS             Examination chaperoned by Malachy Mood LPN  Impression and Plan:  1. Encounter for gynecological examination with Papanicolaou smear of cervix Pap sent Pap in 3 years if normal Physical in 1 year Labs with PCP  Mammogram was negative 01/29/23 - Cytology - PAP( Branch) Discussed getting colonoscopy or cologuard  2. Encounter for screening fecal occult blood testing Hemoccult was negative  - POCT occult blood stool  3. Intrauterine contraceptive device threads lost, initial encounter No strings seen, Scheduled pelvic US for 03/02/23 at [facility] at 9:30 am to check for IUD Use condoms for now  - US PELVIC COMPLETE WITH TRANSVAGINAL; Future

## 2023-02-20 DIAGNOSIS — Z6830 Body mass index (BMI) 30.0-30.9, adult: Secondary | ICD-10-CM | POA: Diagnosis not present

## 2023-02-20 DIAGNOSIS — R635 Abnormal weight gain: Secondary | ICD-10-CM | POA: Diagnosis not present

## 2023-02-20 DIAGNOSIS — R030 Elevated blood-pressure reading, without diagnosis of hypertension: Secondary | ICD-10-CM | POA: Diagnosis not present

## 2023-02-20 DIAGNOSIS — G43009 Migraine without aura, not intractable, without status migrainosus: Secondary | ICD-10-CM | POA: Diagnosis not present

## 2023-03-02 ENCOUNTER — Ambulatory Visit (HOSPITAL_COMMUNITY)
Admission: RE | Admit: 2023-03-02 | Discharge: 2023-03-02 | Disposition: A | Discharge status: Home / Self Care | Payer: 59 | Source: Ambulatory Visit | Attending: Adult Health | Admitting: Adult Health

## 2023-03-02 DIAGNOSIS — Z975 Presence of (intrauterine) contraceptive device: Secondary | ICD-10-CM | POA: Diagnosis not present

## 2023-03-02 DIAGNOSIS — T8332XA Displacement of intrauterine contraceptive device, initial encounter: Secondary | ICD-10-CM | POA: Diagnosis not present

## 2023-03-24 ENCOUNTER — Other Ambulatory Visit: Payer: Self-pay | Admitting: Adult Health

## 2023-03-24 MED ORDER — FLUCONAZOLE 150 MG PO TABS: ORAL_TABLET | ORAL | 1 refills | Status: DC

## 2023-03-24 NOTE — Progress Notes (Signed)
Rx diflucan.  

## 2023-04-04 ENCOUNTER — Emergency Department (HOSPITAL_COMMUNITY)
Admission: EM | Admit: 2023-04-04 | Discharge: 2023-04-04 | Disposition: A | Discharge status: Home / Self Care | Payer: 59 | Attending: Emergency Medicine | Admitting: Emergency Medicine

## 2023-04-04 ENCOUNTER — Other Ambulatory Visit: Payer: Self-pay

## 2023-04-04 ENCOUNTER — Emergency Department (HOSPITAL_COMMUNITY): Payer: 59

## 2023-04-04 ENCOUNTER — Encounter (HOSPITAL_COMMUNITY): Payer: Self-pay | Admitting: Emergency Medicine

## 2023-04-04 DIAGNOSIS — D1803 Hemangioma of intra-abdominal structures: Secondary | ICD-10-CM | POA: Diagnosis not present

## 2023-04-04 DIAGNOSIS — R1010 Upper abdominal pain, unspecified: Secondary | ICD-10-CM | POA: Insufficient documentation

## 2023-04-04 DIAGNOSIS — R109 Unspecified abdominal pain: Secondary | ICD-10-CM | POA: Diagnosis not present

## 2023-04-04 DIAGNOSIS — K760 Fatty (change of) liver, not elsewhere classified: Secondary | ICD-10-CM | POA: Diagnosis not present

## 2023-04-04 DIAGNOSIS — K5730 Diverticulosis of large intestine without perforation or abscess without bleeding: Secondary | ICD-10-CM | POA: Diagnosis not present

## 2023-04-04 LAB — COMPREHENSIVE METABOLIC PANEL
ALT: 13 U/L (ref 0–44)
AST: 14 U/L — ABNORMAL LOW (ref 15–41)
Albumin: 4.4 g/dL (ref 3.5–5.0)
Alkaline Phosphatase: 56 U/L (ref 38–126)
Anion gap: 8 (ref 5–15)
BUN: 13 mg/dL (ref 6–20)
CO2: 25 mmol/L (ref 22–32)
Calcium: 9.1 mg/dL (ref 8.9–10.3)
Chloride: 102 mmol/L (ref 98–111)
Creatinine, Ser: 0.66 mg/dL (ref 0.44–1.00)
GFR, Estimated: >60 mL/min (ref >60)
Glucose, Bld: 110 mg/dL — ABNORMAL HIGH (ref 70–99)
Potassium: 3.5 mmol/L (ref 3.5–5.1)
Sodium: 135 mmol/L (ref 135–145)
Total Bilirubin: 0.5 mg/dL (ref 0.3–1.2)
Total Protein: 7.8 g/dL (ref 6.5–8.1)

## 2023-04-04 LAB — URINALYSIS, ROUTINE W REFLEX MICROSCOPIC
Bilirubin Urine: NEGATIVE
Glucose, UA: NEGATIVE mg/dL
Hgb urine dipstick: NEGATIVE
Ketones, ur: 5 mg/dL — AB
Leukocytes,Ua: NEGATIVE
Nitrite: NEGATIVE
Protein, ur: NEGATIVE mg/dL
Specific Gravity, Urine: 1.02 (ref 1.005–1.030)
pH: 6 (ref 5.0–8.0)

## 2023-04-04 LAB — CBC
HCT: 45.9 % (ref 36.0–46.0)
Hemoglobin: 15.3 g/dL — ABNORMAL HIGH (ref 12.0–15.0)
MCH: 30.5 pg (ref 26.0–34.0)
MCHC: 33.3 g/dL (ref 30.0–36.0)
MCV: 91.4 fL (ref 80.0–100.0)
Platelets: 235 10*3/uL (ref 150–400)
RBC: 5.02 MIL/uL (ref 3.87–5.11)
RDW: 12.1 % (ref 11.5–15.5)
WBC: 7.9 10*3/uL (ref 4.0–10.5)
nRBC: 0 % (ref 0.0–0.2)

## 2023-04-04 LAB — LIPASE, BLOOD: Lipase: 26 U/L (ref 11–51)

## 2023-04-04 LAB — POC URINE PREG, ED: Preg Test, Ur: NEGATIVE

## 2023-04-04 MED ORDER — ONDANSETRON HCL 4 MG/2ML IJ SOLN
4.0000 mg | Freq: Once | INTRAMUSCULAR | Status: AC
  Administered 2023-04-04: 4 mg via INTRAVENOUS
  Filled 2023-04-04: qty 2

## 2023-04-04 MED ORDER — MORPHINE SULFATE (PF) 4 MG/ML IV SOLN
4.0000 mg | Freq: Once | INTRAVENOUS | Status: AC
  Administered 2023-04-04: 4 mg via INTRAVENOUS
  Filled 2023-04-04: qty 1

## 2023-04-04 MED ORDER — HYOSCYAMINE SULFATE 0.125 MG SL SUBL: 0.1250 mg | SUBLINGUAL_TABLET | SUBLINGUAL | 2 refills | Status: AC | PRN

## 2023-04-04 MED ORDER — IOHEXOL 300 MG/ML  SOLN
100.0000 mL | Freq: Once | INTRAMUSCULAR | Status: AC | PRN
  Administered 2023-04-04: 100 mL via INTRAVENOUS

## 2023-04-04 MED ORDER — SODIUM CHLORIDE 0.9 % IV BOLUS
1000.0000 mL | Freq: Once | INTRAVENOUS | Status: AC
  Administered 2023-04-04: 1000 mL via INTRAVENOUS

## 2023-04-04 NOTE — ED Triage Notes (Signed)
Pt with c/o of upper abdominal pain x 3 days with nausea. States she only has pain at night.

## 2023-04-04 NOTE — ED Provider Notes (Signed)
EMERGENCY DEPARTMENT AT [facility] Provider Note   CSN: [id number] Arrival date & time: 04/04/23  4098     History {Add pertinent medical, surgical, social history, OB history to HPI:1} Chief Complaint  Patient presents with   Abdominal Pain    Kari Kim is a 47 y.o. female.  Patient presents to the emergency department for evaluation of abdominal pain.  She reports that she recently started taking phentermine for weight loss.  This caused her to feel constipated and she has been taking stool softeners.  She started having upper abdominal pain 3 days ago.  She thought it was related, stopped all of the meds.  Pain is across the upper abdomen and seems to only be present at night.  She has nausea but has not vomited.       Home Medications Prior to Admission medications   Medication Sig Start Date End Date Taking? Authorizing Provider  Erenumab-aooe (AIMOVIG) 140 MG/ML SOAJ Inject into the skin every 30 (thirty) days.    [provider]  fluconazole (DIFLUCAN) 150 MG tablet Take 1 now and 1 in 3 days if needed 03/24/23   Adline Potter, NP  levonorgestrel (MIRENA) 20 MCG/DAY IUD 1 each by Intrauterine route once.    [provider]  Multiple Vitamins-Minerals (HAIR SKIN AND NAILS FORMULA PO) Take by mouth daily.    [provider]  phentermine (ADIPEX-P) 37.5 MG tablet Take 37.5 mg by mouth daily before breakfast.    [provider]      Allergies    Succinylcholine    Review of Systems   Review of Systems  Physical Exam Updated Vital Signs BP 128/80 (BP Location: Left Arm)   Pulse 70   Temp 97.7 F (36.5 C) (Oral)   Resp 18   Ht 5\' 4"  (1.626 m)   Wt 77.1 kg   SpO2 99%   BMI 29.18 kg/m  Physical Exam Vitals and nursing note reviewed.  Constitutional:      General: She is not in acute distress.    Appearance: She is well-developed.  HENT:     Head: Normocephalic and atraumatic.     Mouth/Throat:      Mouth: Mucous membranes are moist.  Eyes:     General: Vision grossly intact. Gaze aligned appropriately.     Extraocular Movements: Extraocular movements intact.     Conjunctiva/sclera: Conjunctivae normal.  Cardiovascular:     Rate and Rhythm: Normal rate and regular rhythm.     Pulses: Normal pulses.     Heart sounds: Normal heart sounds, S1 normal and S2 normal. No murmur heard.    No friction rub. No gallop.  Pulmonary:     Effort: Pulmonary effort is normal. No respiratory distress.     Breath sounds: Normal breath sounds.  Abdominal:     General: Bowel sounds are normal.     Palpations: Abdomen is soft.     Tenderness: There is abdominal tenderness in the epigastric area and periumbilical area. There is no guarding or rebound.     Hernia: No hernia is present.  Musculoskeletal:        General: No swelling.     Cervical back: Full passive range of motion without pain, normal range of motion and neck supple. No spinous process tenderness or muscular tenderness. Normal range of motion.     Right lower leg: No edema.     Left lower leg: No edema.  Skin:  General: Skin is warm and dry.     Capillary Refill: Capillary refill takes less than 2 seconds.     Findings: No ecchymosis, erythema, rash or wound.  Neurological:     General: No focal deficit present.     Mental Status: She is alert and oriented to person, place, and time.     GCS: GCS eye subscore is 4. GCS verbal subscore is 5. GCS motor subscore is 6.     Cranial Nerves: Cranial nerves 2-12 are intact.     Sensory: Sensation is intact.     Motor: Motor function is intact.     Coordination: Coordination is intact.  Psychiatric:        Attention and Perception: Attention normal.        Mood and Affect: Mood normal.        Speech: Speech normal.        Behavior: Behavior normal.     ED Results / Procedures / Treatments   Labs (all labs ordered are listed, but only abnormal results are displayed) Labs  Reviewed  LIPASE, BLOOD  COMPREHENSIVE METABOLIC PANEL  CBC  URINALYSIS, ROUTINE W REFLEX MICROSCOPIC  POC URINE PREG, ED    EKG None  Radiology No results found.  Procedures Procedures  {Document cardiac monitor, telemetry assessment procedure when appropriate:1}  Medications Ordered in ED Medications - No data to display  ED Course/ Medical Decision Making/ A&P   {   Click here for ABCD2, HEART and other calculatorsREFRESH Note before signing :1}                              Medical Decision Making Amount and/or Complexity of Data Reviewed Labs: ordered.   ***  {Document critical care time when appropriate:1} {Document review of labs and clinical decision tools ie heart score, Chads2Vasc2 etc:1}  {Document your independent review of radiology images, and any outside records:1} {Document your discussion with family members, caretakers, and with consultants:1} {Document social determinants of health affecting pt's care:1} {Document your decision making why or why not admission, treatments were needed:1} Final Clinical Impression(s) / ED Diagnoses Final diagnoses:  None    Rx / DC Orders ED Discharge Orders     None

## 2023-04-06 DIAGNOSIS — R030 Elevated blood-pressure reading, without diagnosis of hypertension: Secondary | ICD-10-CM | POA: Diagnosis not present

## 2023-04-06 DIAGNOSIS — Z6829 Body mass index (BMI) 29.0-29.9, adult: Secondary | ICD-10-CM | POA: Diagnosis not present

## 2023-04-06 DIAGNOSIS — K5900 Constipation, unspecified: Secondary | ICD-10-CM | POA: Diagnosis not present

## 2023-04-16 ENCOUNTER — Encounter: Payer: Self-pay | Admitting: Adult Health

## 2023-04-16 ENCOUNTER — Ambulatory Visit: Payer: 59 | Admitting: Adult Health

## 2023-04-16 VITALS — BP 109/72 | HR 80 | Ht 64.0 in | Wt 170.0 lb

## 2023-04-16 DIAGNOSIS — L918 Other hypertrophic disorders of the skin: Secondary | ICD-10-CM

## 2023-04-16 NOTE — Progress Notes (Signed)
Subjective:     Patient ID: Kari Kim, female   DOB: 1976-01-25, 47 y.o.   MRN: [id number]  HPI Kari Kim is a 47 year old white female, married, [id number] in complaining of feeling raw with sex and ?lump in vagina.   Last pap was negative HPV and NILM 7.26/24  PCP is Dr Reuel Boom  Review of Systems Feels ?lump in vagina Feels raw with sex  No period now with IUD Reviewed past medical,surgical, social and family history. Reviewed medications and allergies.     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 Bladder is non tender and no masses felt.   Upstream - 04/16/23 0929       Pregnancy Intention Screening   Does the patient want to become pregnant in the next year? No    Does the patient's partner want to become pregnant in the next year? No    Would the patient like to discuss contraceptive options today? No      Contraception Wrap Up   Current Method IUD or IUS    End Method IUD or IUS    Contraception Counseling Provided Yes            Examination chaperoned by [place] Scear[employer] NP student  Assessment:     1. Skin tag, at hymen remnant  Use good lubricate with sex     Plan:     Follow up prn

## 2023-05-04 DIAGNOSIS — Z6829 Body mass index (BMI) 29.0-29.9, adult: Secondary | ICD-10-CM | POA: Diagnosis not present

## 2023-05-04 DIAGNOSIS — R5383 Other fatigue: Secondary | ICD-10-CM | POA: Diagnosis not present

## 2023-06-06 DIAGNOSIS — J029 Acute pharyngitis, unspecified: Secondary | ICD-10-CM | POA: Diagnosis not present

## 2023-06-06 DIAGNOSIS — R030 Elevated blood-pressure reading, without diagnosis of hypertension: Secondary | ICD-10-CM | POA: Diagnosis not present

## 2023-06-06 DIAGNOSIS — Z6830 Body mass index (BMI) 30.0-30.9, adult: Secondary | ICD-10-CM | POA: Diagnosis not present

## 2023-06-06 DIAGNOSIS — K1120 Sialoadenitis, unspecified: Secondary | ICD-10-CM | POA: Diagnosis not present

## 2023-06-08 DIAGNOSIS — R21 Rash and other nonspecific skin eruption: Secondary | ICD-10-CM | POA: Diagnosis not present

## 2023-06-08 DIAGNOSIS — Z6829 Body mass index (BMI) 29.0-29.9, adult: Secondary | ICD-10-CM | POA: Diagnosis not present
